# Patient Record
Sex: Female | Born: 1955 | Race: White | Hispanic: No | Marital: Married | Smoking: Never smoker
Health system: Southern US, Community
[De-identification: ages and names within clinical notes are randomized; demographics above are authoritative.]

## PROBLEM LIST (undated history)

## (undated) DIAGNOSIS — I10 Essential (primary) hypertension: Secondary | ICD-10-CM

## (undated) DIAGNOSIS — E119 Type 2 diabetes mellitus without complications: Secondary | ICD-10-CM

## (undated) DIAGNOSIS — E785 Hyperlipidemia, unspecified: Secondary | ICD-10-CM

## (undated) HISTORY — PX: DILATION AND CURETTAGE OF UTERUS: SHX78

## (undated) HISTORY — PX: JOINT REPLACEMENT: SHX530

---

## 2016-09-03 ENCOUNTER — Encounter (HOSPITAL_BASED_OUTPATIENT_CLINIC_OR_DEPARTMENT_OTHER): Payer: Self-pay | Admitting: *Deleted

## 2016-09-03 ENCOUNTER — Emergency Department (HOSPITAL_BASED_OUTPATIENT_CLINIC_OR_DEPARTMENT_OTHER): Payer: BLUE CROSS/BLUE SHIELD

## 2016-09-03 ENCOUNTER — Emergency Department (HOSPITAL_BASED_OUTPATIENT_CLINIC_OR_DEPARTMENT_OTHER)
Admission: EM | Admit: 2016-09-03 | Discharge: 2016-09-03 | Disposition: A | Discharge status: Home / Self Care | Payer: BLUE CROSS/BLUE SHIELD | Attending: Emergency Medicine | Admitting: Emergency Medicine

## 2016-09-03 DIAGNOSIS — Y999 Unspecified external cause status: Secondary | ICD-10-CM | POA: Insufficient documentation

## 2016-09-03 DIAGNOSIS — Z7982 Long term (current) use of aspirin: Secondary | ICD-10-CM | POA: Diagnosis not present

## 2016-09-03 DIAGNOSIS — I10 Essential (primary) hypertension: Secondary | ICD-10-CM | POA: Insufficient documentation

## 2016-09-03 DIAGNOSIS — S0181XA Laceration without foreign body of other part of head, initial encounter: Secondary | ICD-10-CM | POA: Diagnosis not present

## 2016-09-03 DIAGNOSIS — W1839XA Other fall on same level, initial encounter: Secondary | ICD-10-CM | POA: Insufficient documentation

## 2016-09-03 DIAGNOSIS — S0990XA Unspecified injury of head, initial encounter: Secondary | ICD-10-CM | POA: Diagnosis present

## 2016-09-03 DIAGNOSIS — S8001XA Contusion of right knee, initial encounter: Secondary | ICD-10-CM | POA: Diagnosis not present

## 2016-09-03 DIAGNOSIS — W19XXXA Unspecified fall, initial encounter: Secondary | ICD-10-CM

## 2016-09-03 DIAGNOSIS — Y929 Unspecified place or not applicable: Secondary | ICD-10-CM | POA: Insufficient documentation

## 2016-09-03 DIAGNOSIS — E119 Type 2 diabetes mellitus without complications: Secondary | ICD-10-CM | POA: Insufficient documentation

## 2016-09-03 DIAGNOSIS — Y939 Activity, unspecified: Secondary | ICD-10-CM | POA: Diagnosis not present

## 2016-09-03 DIAGNOSIS — S0083XA Contusion of other part of head, initial encounter: Secondary | ICD-10-CM

## 2016-09-03 HISTORY — DX: Essential (primary) hypertension: I10

## 2016-09-03 HISTORY — DX: Hyperlipidemia, unspecified: E78.5

## 2016-09-03 HISTORY — DX: Type 2 diabetes mellitus without complications: E11.9

## 2016-09-03 MED ORDER — LIDOCAINE-EPINEPHRINE 1 %-1:100000 IJ SOLN
INTRAMUSCULAR | Status: AC
  Filled 2016-09-03: qty 1

## 2016-09-03 NOTE — ED Triage Notes (Signed)
Pt brought in by ems with fall from standing landing on tile floor. LAc to forehead x 2 , NO LOC

## 2016-09-03 NOTE — ED Provider Notes (Signed)
[facility] Note   CSN: [id number] Arrival date & time: 09/03/16  1811  By signing my name below, I, [name], attest that this documentation has been prepared under the direction and in the presence of Geoffery Lyons, MD. Electronically Signed: [name], Scribe. 09/03/2016. 6:24 PM.  History   Chief Complaint Chief Complaint  Patient presents with  . Head Injury   The history is provided by the patient. No language interpreter was used.  Head Injury   The incident occurred 3 to 5 hours ago. The injury mechanism was a fall. There was no loss of consciousness. The volume of blood lost was moderate. The pain is moderate. The pain has been constant since the injury.   HPI Comments: Kaitlin Burgess is a 61 y.o. female who presents to the Emergency Department complaining of a fall that occurred today. She states she tripped over her child and landed face-first onto tile floor. She denies any LOC. She reports associated head wounds and right knee pain. Her tetanus status is UTD. She denies any anti-coagulant use, neck pain, gait problem, or other complaints.   Past Medical History:  Diagnosis Date  . Diabetes mellitus without complication (HCC)   . Hyperlipemia   . Hypertension     There are no active problems to display for this patient.   Past Surgical History:  Procedure Laterality Date  . DILATION AND CURETTAGE OF UTERUS    . JOINT REPLACEMENT      OB History    No data available       Home Medications    Prior to Admission medications   Medication Sig Start Date End Date Taking? Authorizing Provider  aspirin EC 81 MG tablet Take 81 mg by mouth daily.   Yes Historical Provider, MD  ranitidine (ZANTAC) 150 MG tablet Take 150 mg by mouth 2 (two) times daily.   Yes Historical Provider, MD    Family History History reviewed. No pertinent family history.  Social History Social History  Substance Use Topics  . Smoking status: Never  Smoker  . Smokeless tobacco: Not on file  . Alcohol use No     Allergies   Hydrocodone and Penicillins   Review of Systems Review of Systems  Musculoskeletal: Positive for myalgias (Right knee) and neck pain. Negative for gait problem.  Neurological: Negative for syncope.  Hematological: Does not bruise/bleed easily.  All other systems reviewed and are negative.    Physical Exam Updated Vital Signs BP 143/68   Pulse 81   Temp 98 F (36.7 C) (Oral)   Resp 16   Ht 5' (1.524 m)   Wt 165 lb (74.8 kg)   SpO2 100%   BMI 32.22 kg/m   Physical Exam  Constitutional: She is oriented to person, place, and time. She appears well-developed and well-nourished. No distress.  HENT:  Head: Normocephalic.  Large hematoma to the forehead. 2 lacerations oriented horizontal to the forehead. One measures 2.5 cm and is through the dermis. Lower laceration is superficial and 1.5 cm.   Eyes: EOM are normal.  Neck: Normal range of motion.  Cardiovascular: Normal rate, regular rhythm and normal heart sounds.   Pulmonary/Chest: Effort normal and breath sounds normal.  Abdominal: Soft. She exhibits no distension. There is no tenderness.  Musculoskeletal: Normal range of motion.  Neurological: She is alert and oriented to person, place, and time.  Skin: Skin is warm and dry.  Psychiatric: She has a normal mood and affect. Judgment  normal.  Nursing note and vitals reviewed.    ED Treatments / Results  DIAGNOSTIC STUDIES: Oxygen Saturation is 100% on RA, normal by my interpretation.    COORDINATION OF CARE: 6:28 PM- Pt advised of plan for treatment and pt agrees.  Labs (all labs ordered are listed, but only abnormal results are displayed) Labs Reviewed - No data to display  EKG  EKG Interpretation None       Radiology No results found.  Procedures Procedures (including critical care time)  Medications Ordered in ED Medications  lidocaine-EPINEPHrine (XYLOCAINE W/EPI) 1  %-1:100000 (with pres) injection (not administered)     Initial Impression / Assessment and Plan / ED Course  I have reviewed the triage vital signs and the nursing notes.  Pertinent labs & imaging results that were available during my care of the patient were reviewed by me and considered in my medical decision making (see chart for details).  Patient presents here after a fall. She was walking with her grandchildren when she tripped and struck her head on a tile floor. There is no loss of consciousness and she is neurologically intact. She does have a large hematoma to the forehead along with 2 lacerations, one of which was sutured, the other superficial.  LACERATION REPAIR Performed by: Geoffery LyonseLo, Mabel Roll Authorized by: Geoffery LyonseLo, Cesare Sumlin Consent: Verbal consent obtained. Risks and benefits: risks, benefits and alternatives were discussed Consent given by: patient Patient identity confirmed: provided demographic data Prepped and Draped in normal sterile fashion Wound explored  Laceration Location: Forehead  Laceration Length: 2.5 cm  No Foreign Bodies seen or palpated  Anesthesia: local infiltration  Local anesthetic: lidocaine 1 % with epinephrine  Anesthetic total: 2 ml  Irrigation method: syringe Amount of cleaning: standard  Skin closure: 5-0 Prolene   Number of sutures: 4   Technique: Simple interrupted   Patient tolerance: Patient tolerated the procedure well with no immediate complications.   X-rays of the knee revealed no evidence for fracture. She does have some swelling to the prepatellar soft tissues, however the knee itself is stable with good range of motion.  Final Clinical Impressions(s) / ED Diagnoses   Final diagnoses:  None    New Prescriptions New Prescriptions   No medications on file   I personally performed the services described in this documentation, which was scribed in my presence. The recorded information has been reviewed and is accurate.         Geoffery Lyonsouglas Ulisses Vondrak, MD 09/03/16 [phone]

## 2016-09-03 NOTE — Discharge Instructions (Signed)
Local wound care with bacitracin and dressing changes twice daily.  Return to the emergency department if you develop worsening headache, visual disturbances, increased pain, redness, or pus draining from the wound.  Sutures are to be removed in 5 days. Please follow-up with your primary Dr. for this.

## 2018-03-14 IMAGING — CR DG KNEE COMPLETE 4+V*R*
4 series · 4 of 4 positions shown · non-contrast
Comparison: None.

CLINICAL DATA: Right knee pain after falling. History of
hypertension and diabetes.

EXAM:
RIGHT KNEE - COMPLETE 4+ VIEW

[t knee ap right]
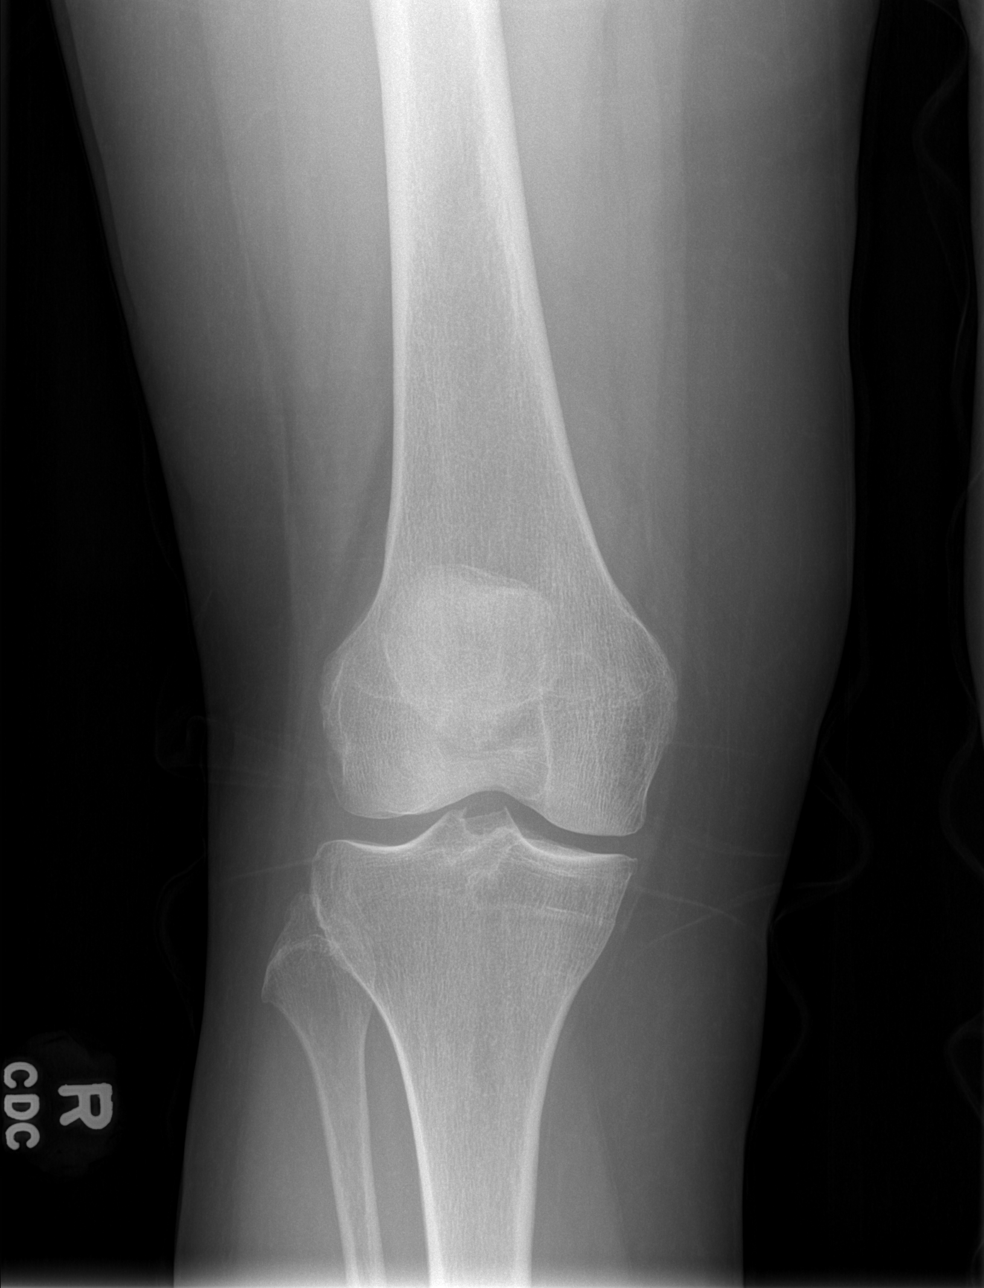

[t knee oblique right (1 of 2)]
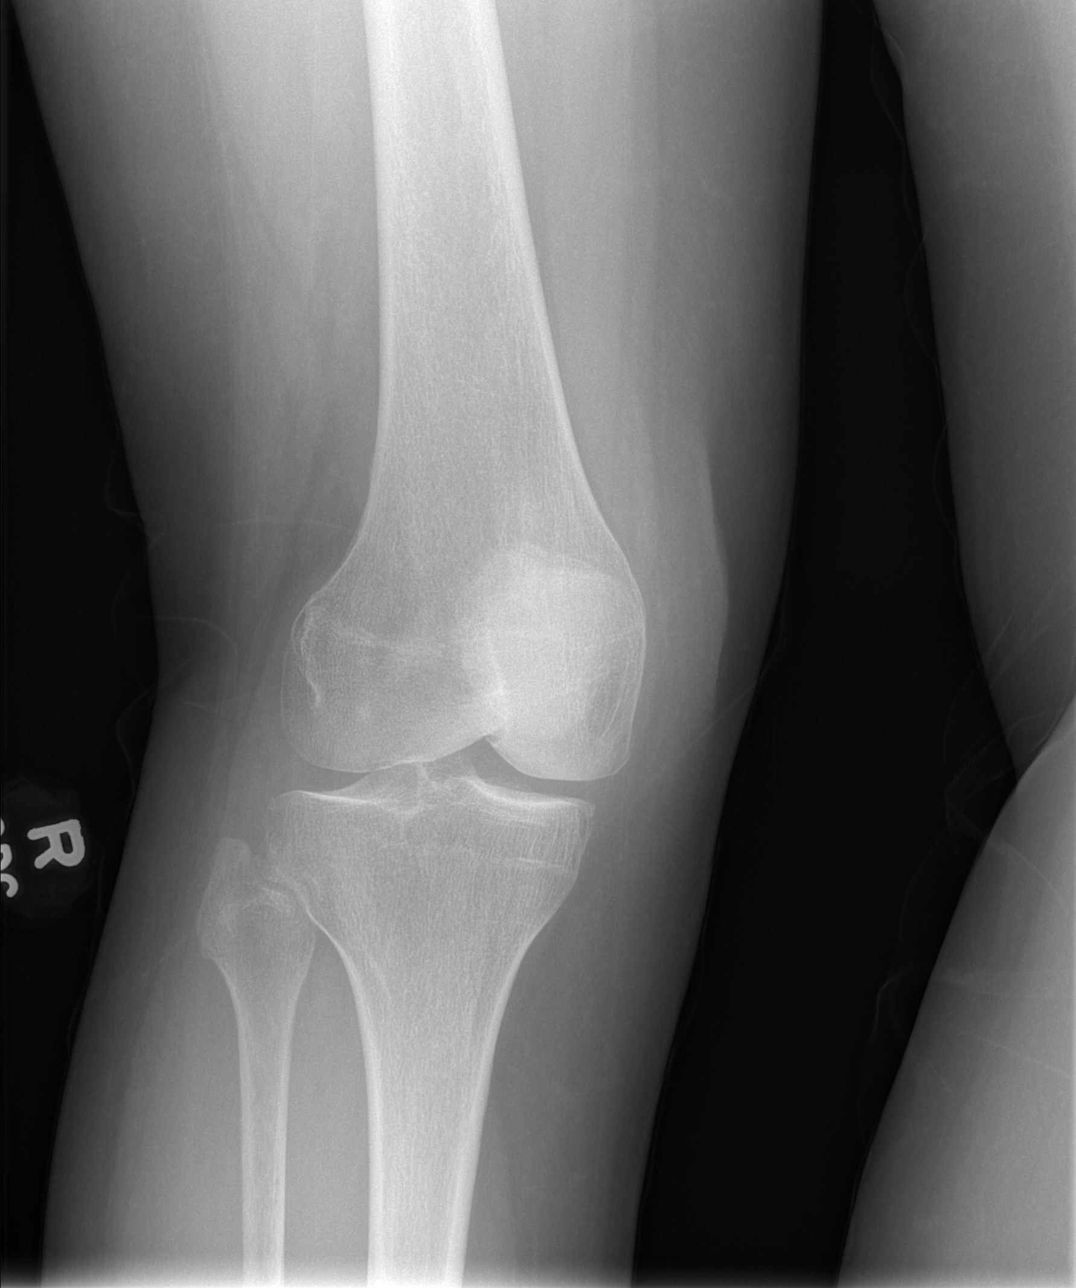

[t knee oblique right (2 of 2)]
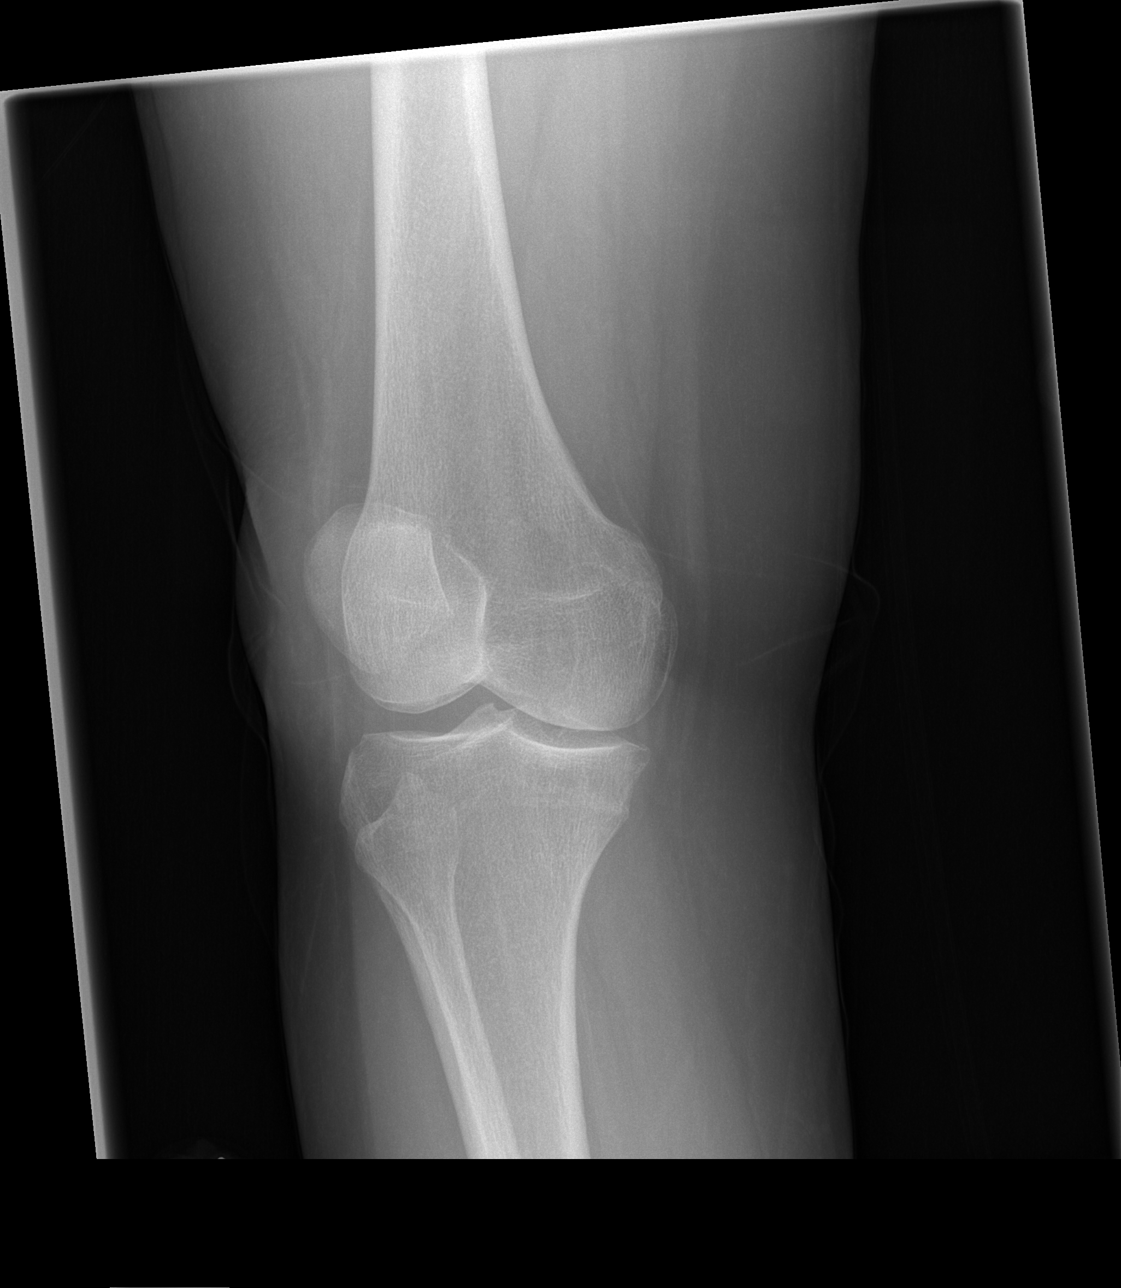

[t knee lat right]
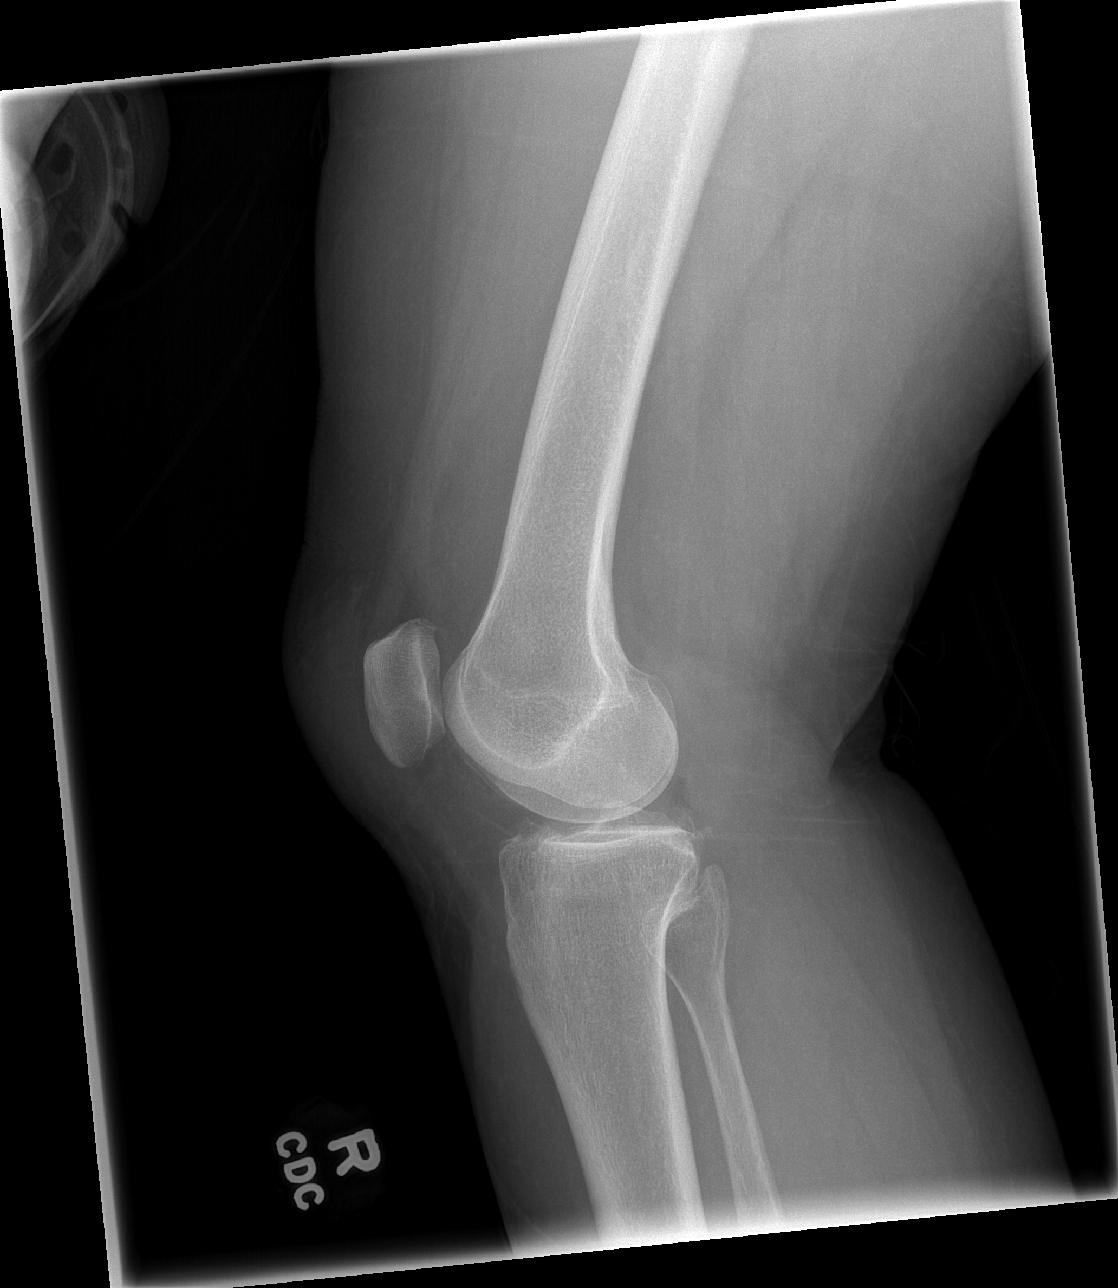

[4 of 4 positions shown; findings below may reference images not displayed]

FINDINGS: The mineralization and alignment are normal. There is no evidence of
acute fracture or dislocation. There is a focal prepatellar soft
tissue swelling. There is a possible small knee joint effusion. The
joint spaces are preserved.
IMPRESSION: Prepatellar soft tissue swelling consistent with hematoma or
bursitis. No acute osseous findings.
